# Patient Record
Sex: Male | Born: 1950 | Race: White | Hispanic: No | Marital: Married | Smoking: Former smoker
Health system: Southern US, Community
[De-identification: ages and names within clinical notes are randomized; demographics above are authoritative.]

## PROBLEM LIST (undated history)

## (undated) HISTORY — PX: JOINT REPLACEMENT: SHX530

## (undated) HISTORY — PX: BACK SURGERY: SHX140

---

## 2013-01-03 ENCOUNTER — Ambulatory Visit: Payer: Self-pay | Admitting: Family Medicine

## 2014-11-02 ENCOUNTER — Ambulatory Visit
Admission: EM | Admit: 2014-11-02 | Discharge: 2014-11-02 | Disposition: A | Discharge status: Home / Self Care | Payer: BC Managed Care – PPO | Attending: Family Medicine | Admitting: Family Medicine

## 2014-11-02 ENCOUNTER — Encounter: Payer: Self-pay | Admitting: Emergency Medicine

## 2014-11-02 ENCOUNTER — Ambulatory Visit: Payer: BC Managed Care – PPO

## 2014-11-02 DIAGNOSIS — J40 Bronchitis, not specified as acute or chronic: Secondary | ICD-10-CM | POA: Diagnosis not present

## 2014-11-02 DIAGNOSIS — J0100 Acute maxillary sinusitis, unspecified: Secondary | ICD-10-CM | POA: Diagnosis not present

## 2014-11-02 MED ORDER — BENZONATATE 100 MG PO CAPS: 100.0000 mg | ORAL_CAPSULE | Freq: Three times a day (TID) | ORAL | Status: DC | PRN

## 2014-11-02 MED ORDER — AZITHROMYCIN 250 MG PO TABS: ORAL_TABLET | ORAL | Status: DC

## 2014-11-02 NOTE — ED Notes (Signed)
Pt with a cough and a head cough with green drainage

## 2014-11-02 NOTE — ED Provider Notes (Signed)
[facility] Emergency Department Provider Note  ____________________________________________  Time seen: Approximately 9:41 AM  I have reviewed the triage vital signs and the nursing notes.   HISTORY  Chief Complaint Cough   HPI Derek Osborne is a 63 y.o. male presents for runny nose, sinus pressure, cough and congestion x 6-7 days. States started as runny nose then continued and progressed. States coughs more at night with post nasal drainage. States green thick nasal drainage. reports continues to eat and drink well. Denies chest pain, shortness of breath, wheezing, abdominal pain, fever. Reports continues to eat and drink well. States sinus pressure is 3/10 aching.    History reviewed. No pertinent past medical history.  Left hip replacement. Right cochlear implant 2005 Hyperlipidemia  There are no active problems to display for this patient.  Reports follows with his PCP regularly and had physical last month.  Past Surgical History  Procedure Laterality Date  . Back surgery      Current Outpatient Rx  Name  Route  Sig  Dispense  Refill  .           Marland Kitchen             Allergies Review of patient's allergies indicates no known allergies.  History reviewed. No pertinent family history.  Social History Social History  Substance Use Topics  . Smoking status: Never Smoker   . Smokeless tobacco: None  . Alcohol Use: No    Review of Systems Constitutional: No fever/chills Eyes: No visual changes. WUJ:WJXBJ nose, congestion and cough as above. Cardiovascular: Denies chest pain. Respiratory: Denies shortness of breath. Gastrointestinal: No abdominal pain.  No nausea, no vomiting.  No diarrhea.  No constipation. Genitourinary: Negative for dysuria. Musculoskeletal: Negative for back pain. Skin: Negative for rash. Neurological: Negative for headaches, focal weakness or numbness.  10-point ROS otherwise  negative.  ____________________________________________   PHYSICAL EXAM:  VITAL SIGNS: ED Triage Vitals  Enc Vitals Group     BP 11/02/14 0858 121/73 mmHg     Pulse Rate 11/02/14 0858 63     Resp 11/02/14 0858 18     Temp 11/02/14 0858 97.4 F (36.3 C)     Temp Source 11/02/14 0858 Tympanic     SpO2 11/02/14 0858 99 %     Weight 11/02/14 0858 182 lb (82.555 kg)     Height 11/02/14 0858 5\' 9"  (1.753 m)     Head Cir --      Peak Flow --      Pain Score 11/02/14 0900 3     Pain Loc --      Pain Edu? --      Excl. in GC? --     Constitutional: Alert and oriented. Well appearing and in no acute distress. Eyes: Conjunctivae are normal. PERRL. EOMI. Head: [employer].fr maxillary sinus TTP.   Ears: no erythema, no erythema, mild dullness bilaterally.   Nose: mild clear rhinorrhea, bilateral mild turbinate edema, bilateral nares patent.   Mouth/Throat: Mucous membranes are moist.  Mild pharyngeal erythema, no tonsillar swelling or exudate.  Neck: No stridor.  No cervical spine tenderness to palpation. Hematological/Lymphatic/Immunilogical: No cervical lymphadenopathy. Cardiovascular: Normal rate, regular rhythm. Grossly normal heart sounds.  Good peripheral circulation. Respiratory: Normal respiratory effort.  No retractions. No wheezes or rales. Mild intermittent scattered rhonchi .Dry intermittent cough.  Gastrointestinal: Soft and nontender. No distention. Normal Bowel sounds.   Musculoskeletal: No lower or upper extremity tenderness nor edema. Neurologic:  Normal speech and  language. No gross focal neurologic deficits are appreciated. No gait instability. Skin:  Skin is warm, dry and intact. No rash noted. Psychiatric: Mood and affect are normal. Speech and behavior are normal.  ____________________________________________   LABS (all labs ordered are listed, but only abnormal results are displayed)  Labs Reviewed - No data to display  RADIOLOGY  EXAM: CHEST 2  VIEW  COMPARISON: No priors.  FINDINGS: Lung volumes are normal. No consolidative airspace disease. No pleural effusions. No pneumothorax. No pulmonary nodule or mass noted. Pulmonary vasculature and the cardiomediastinal silhouette are within normal limits. Atherosclerosis in the thoracic aorta.  IMPRESSION: 1. No radiographic evidence of acute cardiopulmonary disease. 2. Atherosclerosis.   Electronically Signed By: Trudie Reed M.D. On: 11/02/2014 09:30  I, Renford Dills, personally viewed and evaluated these images (plain radiographs) as part of my medical decision making.  ____________________________________________   INITIAL IMPRESSION / ASSESSMENT AND PLAN / ED COURSE  Pertinent labs & imaging results that were available during my care of the patient were reviewed by me and considered in my medical decision making (see chart for details).  Very well appearing patient. No acute distress. Presents for runny nose, congestion, sinus drainage and intermittent cough x 6-7 days. Chest xray negative for acute cardiopulmonary disease. Discussed chest xray results as well as incidental finding of atherosclerotics, xray copy given to patient and patient verbalized understanding and will follow up with PCP.  Will treat sinusitis and bronchitis with azithromycin. Rest, fluids, and prn tessalon perles. Follow up with PCP this week. Discussed return parameters.  Patient verbalized understanding and agreed to plan.  ____________________________________________    FINAL CLINICAL IMPRESSION(S) / ED DIAGNOSES  Final diagnoses:  Acute maxillary sinusitis, recurrence not specified  Bronchitis       Renford Dills, NP 11/02/14 1004  Renford Dills, NP 11/02/14 1007

## 2014-11-02 NOTE — Discharge Instructions (Signed)
Take medication as prescribed. Rest. Drink plenty of water.   Follow up with your primary care physician this week as needed. Return to Urgent care for new or worsening concerns.   Sinusitis Sinusitis is redness, soreness, and puffiness (inflammation) of the air pockets in the bones of your face (sinuses). The redness, soreness, and puffiness can cause air and mucus to get trapped in your sinuses. This can allow germs to grow and cause an infection.  HOME CARE   Drink enough fluids to keep your pee (urine) clear or pale yellow.  Use a humidifier in your home.  Run a hot shower to create steam in the bathroom. Sit in the bathroom with the door closed. Breathe in the steam 3-4 times a day.  Put a warm, moist washcloth on your face 3-4 times a day, or as told by your doctor.  Use salt water sprays (saline sprays) to wet the thick fluid in your nose. This can help the sinuses drain.  Only take medicine as told by your doctor. GET HELP RIGHT AWAY IF:   Your pain gets worse.  You have very bad headaches.  You are sick to your stomach (nauseous).  You throw up (vomit).  You are very sleepy (drowsy) all the time.  Your face is puffy (swollen).  Your vision changes.  You have a stiff neck.  You have trouble breathing. MAKE SURE YOU:   Understand these instructions.  Will watch your condition.  Will get help right away if you are not doing well or get worse. Document Released: 08/02/2007 Document Revised: 11/08/2011 Document Reviewed: 09/19/2011 [facility] Patient Information 2015 [place], [place]. This information is not intended to replace advice given to you by your health care provider. Make sure you discuss any questions you have with your health care provider.

## 2017-06-06 ENCOUNTER — Other Ambulatory Visit: Payer: Self-pay

## 2017-06-06 ENCOUNTER — Ambulatory Visit
Admission: EM | Admit: 2017-06-06 | Discharge: 2017-06-06 | Disposition: A | Discharge status: Home / Self Care | Payer: Medicare Other | Attending: Family Medicine | Admitting: Family Medicine

## 2017-06-06 ENCOUNTER — Encounter: Payer: Self-pay | Admitting: Emergency Medicine

## 2017-06-06 DIAGNOSIS — R05 Cough: Secondary | ICD-10-CM | POA: Diagnosis not present

## 2017-06-06 DIAGNOSIS — R058 Other specified cough: Secondary | ICD-10-CM

## 2017-06-06 MED ORDER — HYDROCOD POLST-CPM POLST ER 10-8 MG/5ML PO SUER: 5.0000 mL | Freq: Every evening | ORAL | 0 refills | Status: DC | PRN

## 2017-06-06 MED ORDER — PREDNISONE 20 MG PO TABS: 40.0000 mg | ORAL_TABLET | Freq: Every day | ORAL | 0 refills | Status: DC

## 2017-06-06 MED ORDER — DOXYCYCLINE HYCLATE 100 MG PO CAPS: 100.0000 mg | ORAL_CAPSULE | Freq: Two times a day (BID) | ORAL | 0 refills | Status: DC

## 2017-06-06 MED ORDER — ALBUTEROL SULFATE HFA 108 (90 BASE) MCG/ACT IN AERS: 2.0000 | INHALATION_SPRAY | Freq: Four times a day (QID) | RESPIRATORY_TRACT | 0 refills | Status: DC | PRN

## 2017-06-06 NOTE — Discharge Instructions (Addendum)
Take medication as prescribed. Rest. Drink plenty of fluids.  ° °Follow up with your primary care physician this week as needed. Return to Urgent care for new or worsening concerns.  ° °

## 2017-06-06 NOTE — ED Triage Notes (Signed)
Patient in today c/o cough x 1 month. Patient has tried OTC Robitussin, Severe Congestion and Cough and Tessalon Perles. Patient denies any other symptoms. States he feels well except for the cough.

## 2017-06-06 NOTE — ED Provider Notes (Signed)
[facility] ____________________________________________  Time seen: Approximately 0900 AM  I have reviewed the triage vital signs and the nursing notes.   HISTORY  Chief Complaint Cough (APPT)   HPI Derek Osborne is a 67 y.o. male presenting for evaluation of cough that is been present for the last 3-4 weeks.  States initially started after his wife had had similar, and reports continues with cough.   reports has had some accompanying nasal congestion but denies sinus, pressure or sore throat.  Denies accompanying fevers.  States other than the cough he feels well.  States occasionally may hear himself wheeze, not consistently.  Denies associated chest pain or shortness of breath.  States cough is mostly a nonproductive hacking cough.  States sleep is disrupted from a cough.  Continues to remain active, and has continued to exercise at the gym.  States unresolved with multiple over-the-counter cough and congestion medications.  Also unresolved with leftover Tessalon Perles.  Denies other complaints.  Denies recent sickness. Denies recent antibiotic use.   [place]   History reviewed. No pertinent past medical history.  There are no active problems to display for this patient.   Past Surgical History:  Procedure Laterality Date  . BACK SURGERY    . JOINT REPLACEMENT Left    hip     No current facility-administered medications for this encounter.   Current Outpatient Medications:  .  acyclovir (ZOVIRAX) 800 MG tablet, Take 1 tablet by mouth daily., Disp: , Rfl:  .  aspirin 81 MG chewable tablet, Chew 1 tablet by mouth daily., Disp: , Rfl:  .  benzonatate (TESSALON PERLES) 100 MG capsule, Take 1 capsule (100 mg total) by mouth 3 (three) times daily as needed for cough., Disp: 15 capsule, Rfl: 0 .  Multiple Vitamin (MULTI-VITAMINS) TABS, Take 1 tablet by mouth daily., Disp: , Rfl:  .  albuterol (PROVENTIL HFA;VENTOLIN HFA) 108 (90  Base) MCG/ACT inhaler, Inhale 2 puffs into the lungs every 6 (six) hours as needed for wheezing or shortness of breath., Disp: 1 Inhaler, Rfl: 0 .  chlorpheniramine-HYDROcodone (TUSSIONEX PENNKINETIC ER) 10-8 MG/5ML SUER, Take 5 mLs by mouth at bedtime as needed for cough. do not drive or operate machinery while taking as can cause drowsiness., Disp: 50 mL, Rfl: 0 .  doxycycline (VIBRAMYCIN) 100 MG capsule, Take 1 capsule (100 mg total) by mouth 2 (two) times daily., Disp: 20 capsule, Rfl: 0 .  predniSONE (DELTASONE) 20 MG tablet, Take 2 tablets (40 mg total) by mouth daily., Disp: 10 tablet, Rfl: 0  Allergies Patient has no known allergies.  Family History  Problem Relation Age of Onset  . Colon cancer Mother   . Liver cancer Father     Social History Social History   Tobacco Use  . Smoking status: Former Smoker    Last attempt to quit: 06/06/1980    Years since quitting: 37.0  . Smokeless tobacco: Never Used  Substance Use Topics  . Alcohol use: No  . Drug use: Never    Review of Systems Constitutional: No fever/chills ENT: No sore throat. As above.  Cardiovascular: Denies chest pain. Respiratory: Denies shortness of breath. Gastrointestinal: No abdominal pain.   Musculoskeletal: Negative for back pain. Skin: Negative for rash.   ____________________________________________   PHYSICAL EXAM:  VITAL SIGNS: ED Triage Vitals  Enc Vitals Group     BP 06/06/17 0813 116/89     Pulse Rate 06/06/17 0813 64     Resp 06/06/17  0813 16     Temp 06/06/17 0813 97.8 F (36.6 C)     Temp Source 06/06/17 0813 Oral     SpO2 06/06/17 0813 100 %     Weight 06/06/17 0814 185 lb (83.9 kg)     Height 06/06/17 0814 5\' 10"  (1.778 m)     Head Circumference --      Peak Flow --      Pain Score 06/06/17 0814 0     Pain Loc --      Pain Edu? --      Excl. in GC? --     Constitutional: Alert and oriented. Well appearing and in no acute distress. Eyes: Conjunctivae are normal. Head:  Atraumatic. No sinus tenderness to palpation. No swelling. No erythema.  Ears: no erythema, normal TMs bilaterally.   Nose:No nasal congestion  Mouth/Throat: Mucous membranes are moist. No pharyngeal erythema. No tonsillar swelling or exudate.  Neck: No stridor.  No cervical spine tenderness to palpation. Hematological/Lymphatic/Immunilogical: No cervical lymphadenopathy. Cardiovascular: Normal rate, regular rhythm. Grossly normal heart sounds.  Good peripheral circulation. Respiratory: Normal respiratory effort.  No retractions. No wheezes, rales or rhonchi. Good air movement.  Dry intermittent cough noted with mild bronchospasm.  Speaks in complete sentences Musculoskeletal: Ambulatory with steady gait.  Neurologic:  Normal speech and language. No gait instability. Skin:  Skin appears warm, dry and intact. No rash noted. Psychiatric: Mood and affect are normal. Speech and behavior are normal.  ___________________________________________   LABS (all labs ordered are listed, but only abnormal results are displayed)  Labs Reviewed - No data to display ____________________________________________   PROCEDURES Procedures   INITIAL IMPRESSION / ASSESSMENT AND PLAN / ED COURSE  Pertinent labs & imaging results that were available during my care of the patient were reviewed by me and considered in my medical decision making (see chart for details).  Well-appearing patient.  No acute distress.  Suspect recentViral or allergy trigger with continued cough.  Will treat with as needed Tussionex at night, oral doxycycline, pre-and albuterol inhaler and prednisone.  Encourage rest, fluids, supportive care. Discussed indication, risks and benefits of medications with patient.  Discussed follow up with Primary care physician this week as needed. Discussed follow up and return parameters including no resolution or any worsening concerns. Patient verbalized understanding and agreed to plan.    ____________________________________________   FINAL CLINICAL IMPRESSION(S) / ED DIAGNOSES  Final diagnoses:  Cough present for greater than 3 weeks     ED Discharge Orders        Ordered    predniSONE (DELTASONE) 20 MG tablet  Daily     06/06/17 0838    chlorpheniramine-HYDROcodone (TUSSIONEX PENNKINETIC ER) 10-8 MG/5ML SUER  At bedtime PRN     06/06/17 0838    albuterol (PROVENTIL HFA;VENTOLIN HFA) 108 (90 Base) MCG/ACT inhaler  Every 6 hours PRN     06/06/17 0838    doxycycline (VIBRAMYCIN) 100 MG capsule  2 times daily     06/06/17 1610       Note: This dictation was prepared with Dragon dictation along with smaller phrase technology. Any transcriptional errors that result from this process are unintentional.         Renford Dills, NP 06/06/17 1228

## 2018-02-09 ENCOUNTER — Ambulatory Visit
Admission: EM | Admit: 2018-02-09 | Discharge: 2018-02-09 | Disposition: A | Discharge status: Home / Self Care | Payer: Medicare Other

## 2018-02-09 ENCOUNTER — Ambulatory Visit
Admission: EM | Admit: 2018-02-09 | Discharge: 2018-02-09 | Disposition: A | Discharge status: Home / Self Care | Payer: Medicare Other | Attending: Emergency Medicine | Admitting: Emergency Medicine

## 2018-02-09 ENCOUNTER — Other Ambulatory Visit: Payer: Self-pay

## 2018-02-09 DIAGNOSIS — B349 Viral infection, unspecified: Secondary | ICD-10-CM | POA: Insufficient documentation

## 2018-02-09 DIAGNOSIS — R509 Fever, unspecified: Secondary | ICD-10-CM

## 2018-02-09 DIAGNOSIS — M7918 Myalgia, other site: Secondary | ICD-10-CM

## 2018-02-09 LAB — RAPID INFLUENZA A&B ANTIGENS
Influenza A (ARMC): NEGATIVE
Influenza B (ARMC): NEGATIVE

## 2018-02-09 NOTE — ED Triage Notes (Signed)
Pt asking for flu test. Has been having chills and generalized bodyaches. "Low grade fever". Sx for past 2 days

## 2018-02-09 NOTE — Discharge Instructions (Signed)
Tylenol or Motrin for your chills and body aches.  If you do have recurrence of your symptoms or they worsen return to our clinic or go to your primary care physician.

## 2018-02-09 NOTE — ED Provider Notes (Signed)
[facility]    CSN: [id number] Arrival date & time: 02/09/18  0841     History   Chief Complaint Chief Complaint  Patient presents with  . Generalized Body Aches  . Chills    HPI Derek Osborne is a 67 y.o. male.   HPI  67 year old male presents with concerns regarding the flu.  He states that for the past 2 days he has had chills and generalized body aches.  He has had a low-grade fever currently at 99 degrees.  He had his flu shot earlier in the year.  He is mostly concerned that he has a party for his 67-year-old grandson and does not want to infect him.  He denies any cough.  He has had no sinus drainage.  Today he appears and feels much better.       History reviewed. No pertinent past medical history.  There are no active problems to display for this patient.   Past Surgical History:  Procedure Laterality Date  . BACK SURGERY    . JOINT REPLACEMENT Left    hip       Home Medications    Prior to Admission medications   Medication Sig Start Date End Date Taking? Authorizing Provider  acyclovir (ZOVIRAX) 800 MG tablet Take 1 tablet by mouth daily. 05/13/16   [provider]  aspirin 81 MG chewable tablet Chew 1 tablet by mouth daily.    [provider]  Multiple Vitamin (MULTI-VITAMINS) TABS Take 1 tablet by mouth daily.    [provider]    Family History Family History  Problem Relation Age of Onset  . Colon cancer Mother   . Liver cancer Father     Social History Social History   Tobacco Use  . Smoking status: Former Smoker    Last attempt to quit: 06/06/1980    Years since quitting: 37.7  . Smokeless tobacco: Never Used  Substance Use Topics  . Alcohol use: No  . Drug use: Never     Allergies   Patient has no known allergies.   Review of Systems Review of Systems  Constitutional: Positive for activity change, chills, fatigue and fever. Negative for appetite change.  HENT: Negative for  congestion and sore throat.   Respiratory: Negative for cough.   All other systems reviewed and are negative.    Physical Exam Triage Vital Signs ED Triage Vitals  Enc Vitals Group     BP 02/09/18 0856 114/72     Pulse Rate 02/09/18 0856 62     Resp 02/09/18 0856 16     Temp 02/09/18 0856 99 F (37.2 C)     Temp Source 02/09/18 0856 Oral     SpO2 02/09/18 0856 99 %     Weight 02/09/18 0857 185 lb (83.9 kg)     Height 02/09/18 0857 5\' 9"  (1.753 m)     Head Circumference --      Peak Flow --      Pain Score 02/09/18 0856 2     Pain Loc --      Pain Edu? --      Excl. in GC? --    No data found.  Updated Vital Signs BP 114/72 (BP Location: Left Arm)   Pulse 62   Temp 99 F (37.2 C) (Oral)   Resp 16   Ht 5\' 9"  (1.753 m)   Wt 185 lb (83.9 kg)   SpO2 99%   BMI 27.32 kg/m  Visual Acuity Right Eye Distance:   Left Eye Distance:   Bilateral Distance:    Right Eye Near:   Left Eye Near:    Bilateral Near:     Physical Exam Vitals signs and nursing note reviewed.  Constitutional:      General: He is not in acute distress.    Appearance: Normal appearance. He is normal weight. He is not ill-appearing, toxic-appearing or diaphoretic.  HENT:     Head: Normocephalic.     Comments: Wearing hearing aid from    Right Ear: Tympanic membrane and ear canal normal.     Left Ear: Tympanic membrane and ear canal normal.     Nose: Nose normal.     Mouth/Throat:     Mouth: Mucous membranes are moist.  Eyes:     Extraocular Movements: Extraocular movements intact.     Conjunctiva/sclera: Conjunctivae normal.     Pupils: Pupils are equal, round, and reactive to light.  Neck:     Musculoskeletal: Normal range of motion and neck supple.  Pulmonary:     Effort: Pulmonary effort is normal.     Breath sounds: Normal breath sounds.  Musculoskeletal: Normal range of motion.  Skin:    General: Skin is warm and dry.  Neurological:     General: No focal deficit present.      Mental Status: He is alert and oriented to person, place, and time.  Psychiatric:        Mood and Affect: Mood normal.        Behavior: Behavior normal.        Thought Content: Thought content normal.        Judgment: Judgment normal.      UC Treatments / Results  Labs (all labs ordered are listed, but only abnormal results are displayed) Labs Reviewed  RAPID INFLUENZA A&B ANTIGENS ([facility] ONLY)    EKG None  Radiology No results found.  Procedures Procedures (including critical care time)  Medications Ordered in UC Medications - No data to display  Initial Impression / Assessment and Plan / UC Course  I have reviewed the triage vital signs and the nursing notes.  Pertinent labs & imaging results that were available during my care of the patient were reviewed by me and considered in my medical decision making (see chart for details).   Have explained to the patient that his exam today is normal.  Not appear ill.  He had a flu shot earlier this year and a likely flulike illness which is resolving quickly.  Him to be cautious around his grandson and should wear masks with close contact.  Should also use the hand sanitizers and wash his hands frequently.  If he does develop worsening symptoms he should return to our clinic or go to his primary care physician.   Final Clinical Impressions(s) / UC Diagnoses   Final diagnoses:  Viral illness     Discharge Instructions     Tylenol or Motrin for your chills and body aches.  If you do have recurrence of your symptoms or they worsen return to our clinic or go to your primary care physician.   ED Prescriptions    None     Controlled Substance Prescriptions [place] Controlled Substance Registry consulted? Not Applicable   Lutricia Feil, PA-C 02/09/18 1206

## 2018-02-12 ENCOUNTER — Ambulatory Visit (INDEPENDENT_AMBULATORY_CARE_PROVIDER_SITE_OTHER): Payer: Medicare Other

## 2018-02-12 ENCOUNTER — Ambulatory Visit
Admission: EM | Admit: 2018-02-12 | Discharge: 2018-02-12 | Disposition: A | Discharge status: Home / Self Care | Payer: Medicare Other | Attending: Internal Medicine | Admitting: Internal Medicine

## 2018-02-12 ENCOUNTER — Encounter: Payer: Self-pay | Admitting: Emergency Medicine

## 2018-02-12 ENCOUNTER — Other Ambulatory Visit: Payer: Self-pay

## 2018-02-12 DIAGNOSIS — J181 Lobar pneumonia, unspecified organism: Secondary | ICD-10-CM | POA: Insufficient documentation

## 2018-02-12 DIAGNOSIS — J189 Pneumonia, unspecified organism: Secondary | ICD-10-CM

## 2018-02-12 MED ORDER — HYDROCOD POLST-CPM POLST ER 10-8 MG/5ML PO SUER: 5.0000 mL | Freq: Two times a day (BID) | ORAL | 0 refills | Status: DC

## 2018-02-12 MED ORDER — AZITHROMYCIN 250 MG PO TABS: 250.0000 mg | ORAL_TABLET | Freq: Every day | ORAL | 0 refills | Status: DC

## 2018-02-12 MED ORDER — ALBUTEROL SULFATE HFA 108 (90 BASE) MCG/ACT IN AERS: 1.0000 | INHALATION_SPRAY | Freq: Four times a day (QID) | RESPIRATORY_TRACT | 0 refills | Status: DC | PRN

## 2018-02-12 NOTE — ED Provider Notes (Signed)
[facility]    CSN: [id number] Arrival date & time: 02/12/18  0803     History   Chief Complaint Chief Complaint  Patient presents with  . Cough    HPI Derek Osborne is a 67 y.o. male.   HPI  67 year old male returns today after being seen on Saturday 3days before this visit and seen by the undersigned.  At that time he was diagnosed with a viral illness.  He states that since that visit he has developed a nagging cough.  He said it is productive on occasion of yellow sputum.  He states it bothers him most is that he gets very winded with very little activity.  He denies any swelling of his feet.  He has had no history of lung disease.  He is a former smoker.  He states surprisingly he is feeling better than he did when he came in before but now is concerned with the cough and the shortness of breath and states that it usually will go into a bronchitis.  [place] at [place] end of the [place].  They are driving with 4 other people.       History reviewed. No pertinent past medical history.  There are no active problems to display for this patient.   Past Surgical History:  Procedure Laterality Date  . BACK SURGERY    . JOINT REPLACEMENT Left    hip       Home Medications    Prior to Admission medications   Medication Sig Start Date End Date Taking? Authorizing Provider  aspirin 81 MG chewable tablet Chew 1 tablet by mouth daily.   Yes [provider]  Multiple Vitamin (MULTI-VITAMINS) TABS Take 1 tablet by mouth daily.   Yes [provider]  acyclovir (ZOVIRAX) 800 MG tablet Take 1 tablet by mouth daily. 05/13/16   [provider]  albuterol (PROVENTIL HFA;VENTOLIN HFA) 108 (90 Base) MCG/ACT inhaler Inhale 1-2 puffs into the lungs every 6 (six) hours as needed for wheezing or shortness of breath. Use with spacer 02/12/18   Lutricia Feiloemer, Naila Elizondo P, PA-C  azithromycin (ZITHROMAX) 250 MG tablet Take 1 tablet (250  mg total) by mouth daily. Take first 2 tablets together, then 1 every day until finished. 02/12/18   Lutricia Feiloemer, Erilyn Pearman P, PA-C  chlorpheniramine-HYDROcodone (TUSSIONEX PENNKINETIC ER) 10-8 MG/5ML SUER Take 5 mLs by mouth 2 (two) times daily. 02/12/18   Lutricia Feiloemer, Marcellis Frampton P, PA-C    Family History Family History  Problem Relation Age of Onset  . Colon cancer Mother   . Liver cancer Father     Social History Social History   Tobacco Use  . Smoking status: Former Smoker    Last attempt to quit: 06/06/1980    Years since quitting: 37.7  . Smokeless tobacco: Never Used  Substance Use Topics  . Alcohol use: No  . Drug use: Never     Allergies   Patient has no known allergies.   Review of Systems Review of Systems  Constitutional: Positive for activity change. Negative for chills, fatigue and fever.  HENT: Positive for congestion.   Respiratory: Positive for cough and shortness of breath.   All other systems reviewed and are negative.    Physical Exam Triage Vital Signs ED Triage Vitals  Enc Vitals Group     BP 02/12/18 0818 119/87     Pulse Rate 02/12/18 0818 65     Resp 02/12/18 0818 16  Temp 02/12/18 0818 98.4 F (36.9 C)     Temp Source 02/12/18 0818 Oral     SpO2 02/12/18 0818 99 %     Weight 02/12/18 0815 185 lb (83.9 kg)     Height 02/12/18 0815 5\' 9"  (1.753 m)     Head Circumference --      Peak Flow --      Pain Score 02/12/18 0815 0     Pain Loc --      Pain Edu? --      Excl. in GC? --    No data found.  Updated Vital Signs BP 119/87 (BP Location: Left Arm)   Pulse 65   Temp 98.4 F (36.9 C) (Oral)   Resp 16   Ht 5\' 9"  (1.753 m)   Wt 185 lb (83.9 kg)   SpO2 99%   BMI 27.32 kg/m   Visual Acuity Right Eye Distance:   Left Eye Distance:   Bilateral Distance:    Right Eye Near:   Left Eye Near:    Bilateral Near:     Physical Exam Vitals signs and nursing note reviewed.  Constitutional:      General: He is not in acute distress.     Appearance: Normal appearance. He is normal weight. He is not ill-appearing, toxic-appearing or diaphoretic.  HENT:     Head: Normocephalic.     Left Ear: Tympanic membrane normal.     Nose: Nose normal.     Mouth/Throat:     Mouth: Mucous membranes are moist.     Pharynx: No oropharyngeal exudate or posterior oropharyngeal erythema.  Eyes:     General:        Right eye: No discharge.        Left eye: No discharge.     Pupils: Pupils are equal, round, and reactive to light.  Neck:     Musculoskeletal: Normal range of motion.  Pulmonary:     Effort: Pulmonary effort is normal.     Breath sounds: Rales present.     Comments: Patient has bibasilar crackles in the lower thirds worse on the left than the right. Abdominal:     General: Abdomen is flat.     Palpations: Abdomen is soft.  Musculoskeletal: Normal range of motion.  Skin:    General: Skin is warm and dry.  Neurological:     General: No focal deficit present.     Mental Status: He is alert and oriented to person, place, and time.  Psychiatric:        Mood and Affect: Mood normal.        Behavior: Behavior normal.        Thought Content: Thought content normal.        Judgment: Judgment normal.      UC Treatments / Results  Labs (all labs ordered are listed, but only abnormal results are displayed) Labs Reviewed - No data to display  EKG None  Radiology Dg Chest 2 View  Result Date: 02/12/2018 CLINICAL DATA:  Cough, short of breath for 5 days with some exertion, chills, former smoking history EXAM: CHEST - 2 VIEW COMPARISON:  Chest x-ray of 11/02/2014 FINDINGS: There is slightly increased opacity posteriorly at the lung base on the lateral view which may be of medially at the right lung base on the frontal view suspicious for a patchy area of pneumonia. Otherwise the lungs are clear with minimal volume loss at the right lung base. No pleural effusion is  seen. Mediastinal and hilar contours are unremarkable and mild  cardiomegaly is stable. There are degenerative changes in the mid to lower thoracic spine. IMPRESSION: Suspect patchy infiltrate posteriorly at the lung base on the lateral view possibly the medial right lung base. Recommend follow-up. Electronically Signed   By: Dwyane Dee M.D.   On: 02/12/2018 08:50    Procedures Procedures (including critical care time)  Medications Ordered in UC Medications - No data to display  Initial Impression / Assessment and Plan / UC Course  I have reviewed the triage vital signs and the nursing notes.  Pertinent labs & imaging results that were available during my care of the patient were reviewed by me and considered in my medical decision making (see chart for details).   Has probable community-acquired pneumonia.  Him with Romycin and albuterol for shortness of breath.  Also provide him with Tussionex for cough suppressant.  Told him that he must have a follow-up chest x-ray in 4 weeks to prove cure.   Final Clinical Impressions(s) / UC Diagnoses   Final diagnoses:  Community acquired pneumonia of right middle lobe of lung [facility])   Discharge Instructions   None    ED Prescriptions    Medication Sig Dispense Auth. Provider   azithromycin (ZITHROMAX) 250 MG tablet Take 1 tablet (250 mg total) by mouth daily. Take first 2 tablets together, then 1 every day until finished. 6 tablet Ovid Curd P, PA-C   albuterol (PROVENTIL HFA;VENTOLIN HFA) 108 (90 Base) MCG/ACT inhaler Inhale 1-2 puffs into the lungs every 6 (six) hours as needed for wheezing or shortness of breath. Use with spacer 1 Inhaler Lutricia Feil, PA-C   chlorpheniramine-HYDROcodone (TUSSIONEX PENNKINETIC ER) 10-8 MG/5ML SUER Take 5 mLs by mouth 2 (two) times daily. 115 mL Lutricia Feil, PA-C     Controlled Substance Prescriptions [place] Controlled Substance Registry consulted? Not Applicable   Lutricia Feil, PA-C 02/12/18 1615

## 2018-02-12 NOTE — ED Triage Notes (Signed)
Patient states that he was seen here on Saturday for viral illness.  Patient states that he has developed a cough for the past 2 days.

## 2019-09-10 ENCOUNTER — Encounter: Payer: Self-pay | Admitting: Emergency Medicine

## 2019-09-10 ENCOUNTER — Ambulatory Visit
Admission: EM | Admit: 2019-09-10 | Discharge: 2019-09-10 | Disposition: A | Discharge status: Home / Self Care | Payer: Medicare PPO | Attending: Family Medicine | Admitting: Family Medicine

## 2019-09-10 DIAGNOSIS — S0501XA Injury of conjunctiva and corneal abrasion without foreign body, right eye, initial encounter: Secondary | ICD-10-CM

## 2019-09-10 DIAGNOSIS — H1131 Conjunctival hemorrhage, right eye: Secondary | ICD-10-CM | POA: Diagnosis not present

## 2019-09-10 MED ORDER — MOXIFLOXACIN HCL 0.5 % OP SOLN: 1.0000 [drp] | Freq: Three times a day (TID) | OPHTHALMIC | 0 refills | Status: DC

## 2019-09-10 NOTE — ED Triage Notes (Signed)
Patient states he got saw dust in his right eye on Saturday. He states his eye is very irritated.

## 2019-09-14 NOTE — ED Provider Notes (Signed)
[facility]    CSN: [id number] Arrival date & time: 09/10/19  1613      History   Chief Complaint Chief Complaint  Patient presents with   Eye Problem    HPI Derek Osborne is a 69 y.o. male.   69 yo male with a c/o right eye irritation since Saturday after getting some saw dust in his eye. Denies pain but states it's become more red. Denies any drainage, fevers, chills, vision changes.    Eye Problem   History reviewed. No pertinent past medical history.  There are no problems to display for this patient.   Past Surgical History:  Procedure Laterality Date   BACK SURGERY     JOINT REPLACEMENT Left    hip       Home Medications    Prior to Admission medications   Medication Sig Start Date End Date Taking? Authorizing Provider  acyclovir (ZOVIRAX) 800 MG tablet Take 1 tablet by mouth daily. 05/13/16  Yes [provider]  albuterol (PROVENTIL HFA;VENTOLIN HFA) 108 (90 Base) MCG/ACT inhaler Inhale 1-2 puffs into the lungs every 6 (six) hours as needed for wheezing or shortness of breath. Use with spacer 02/12/18  Yes Lutricia Feil, PA-C  aspirin 81 MG chewable tablet Chew 1 tablet by mouth daily.   Yes [provider]  tamsulosin (FLOMAX) 0.4 MG CAPS capsule Take 0.4 mg by mouth daily. 07/06/19  Yes [provider]  azithromycin (ZITHROMAX) 250 MG tablet Take 1 tablet (250 mg total) by mouth daily. Take first 2 tablets together, then 1 every day until finished. 02/12/18   Lutricia Feil, PA-C  chlorpheniramine-HYDROcodone (TUSSIONEX PENNKINETIC ER) 10-8 MG/5ML SUER Take 5 mLs by mouth 2 (two) times daily. 02/12/18   Lutricia Feil, PA-C  moxifloxacin (VIGAMOX) 0.5 % ophthalmic solution Place 1 drop into the right eye 3 (three) times daily. 09/10/19   Payton Mccallum, MD  Multiple Vitamin (MULTI-VITAMINS) TABS Take 1 tablet by mouth daily.    [provider]    Family History Family History  Problem Relation  Age of Onset   Colon cancer Mother    Liver cancer Father     Social History Social History   Tobacco Use   Smoking status: Former Smoker    Quit date: 06/06/1980    Years since quitting: 39.2   Smokeless tobacco: Never Used  Vaping Use   Vaping Use: Never used  Substance Use Topics   Alcohol use: No   Drug use: Never     Allergies   Patient has no known allergies.   Review of Systems Review of Systems   Physical Exam Triage Vital Signs ED Triage Vitals  Enc Vitals Group     BP 09/10/19 1635 (!) 147/87     Pulse Rate 09/10/19 1635 66     Resp 09/10/19 1635 18     Temp 09/10/19 1635 98.4 F (36.9 C)     Temp Source 09/10/19 1635 Oral     SpO2 09/10/19 1635 100 %     Weight 09/10/19 1632 184 lb 15.5 oz (83.9 kg)     Height 09/10/19 1632 5\' 9"  (1.753 m)     Head Circumference --      Peak Flow --      Pain Score 09/10/19 1632 0     Pain Loc --      Pain Edu? --      Excl. in GC? --    No data  found.  Updated Vital Signs BP (!) 147/87 (BP Location: Right Arm)    Pulse 66    Temp 98.4 F (36.9 C) (Oral)    Resp 18    Ht 5\' 9"  (1.753 m)    Wt 83.9 kg    SpO2 100%    BMI 27.31 kg/m   Visual Acuity Right Eye Distance:   Left Eye Distance:   Bilateral Distance:    Right Eye Near:   Left Eye Near:    Bilateral Near:     Physical Exam Vitals and nursing note reviewed.  Constitutional:      General: He is not in acute distress.    Appearance: He is not toxic-appearing or diaphoretic.  Eyes:     General: Lids are normal. Lids are everted, no foreign bodies appreciated.     Extraocular Movements: Extraocular movements intact.     Conjunctiva/sclera:     Right eye: Hemorrhage present.     Pupils: Pupils are equal, round, and reactive to light.     Right eye: Corneal abrasion and fluorescein uptake present.  Neurological:     Mental Status: He is alert.      UC Treatments / Results  Labs (all labs ordered are listed, but only abnormal results  are displayed) Labs Reviewed - No data to display  EKG   Radiology No results found.  Procedures Procedures (including critical care time)  Medications Ordered in UC Medications - No data to display  Initial Impression / Assessment and Plan / UC Course  I have reviewed the triage vital signs and the nursing notes.  Pertinent labs & imaging results that were available during my care of the patient were reviewed by me and considered in my medical decision making (see chart for details).      Final Clinical Impressions(s) / UC Diagnoses   Final diagnoses:  Subconjunctival hemorrhage of right eye  Abrasion of right cornea, initial encounter    ED Prescriptions    Medication Sig Dispense Auth. Provider   moxifloxacin (VIGAMOX) 0.5 % ophthalmic solution Place 1 drop into the right eye 3 (three) times daily. 3 mL , MD      1.  diagnosis reviewed with patient 2. rx as per orders above; reviewed possible side effects, interactions, risks and benefits  3. Follow-up prn if symptoms worsen or don't improve  PDMP not reviewed this encounter.   Payton Mccallum, MD 09/14/19 1650

## 2020-07-07 IMAGING — CR DG CHEST 2V
2 series · 2 of 2 positions shown · non-contrast
Comparison: Chest x-ray of 11/02/2014

CLINICAL DATA: Cough, short of breath for 5 days with some
exertion, chills, former smoking history

EXAM:
CHEST - 2 VIEW

[chest pa]
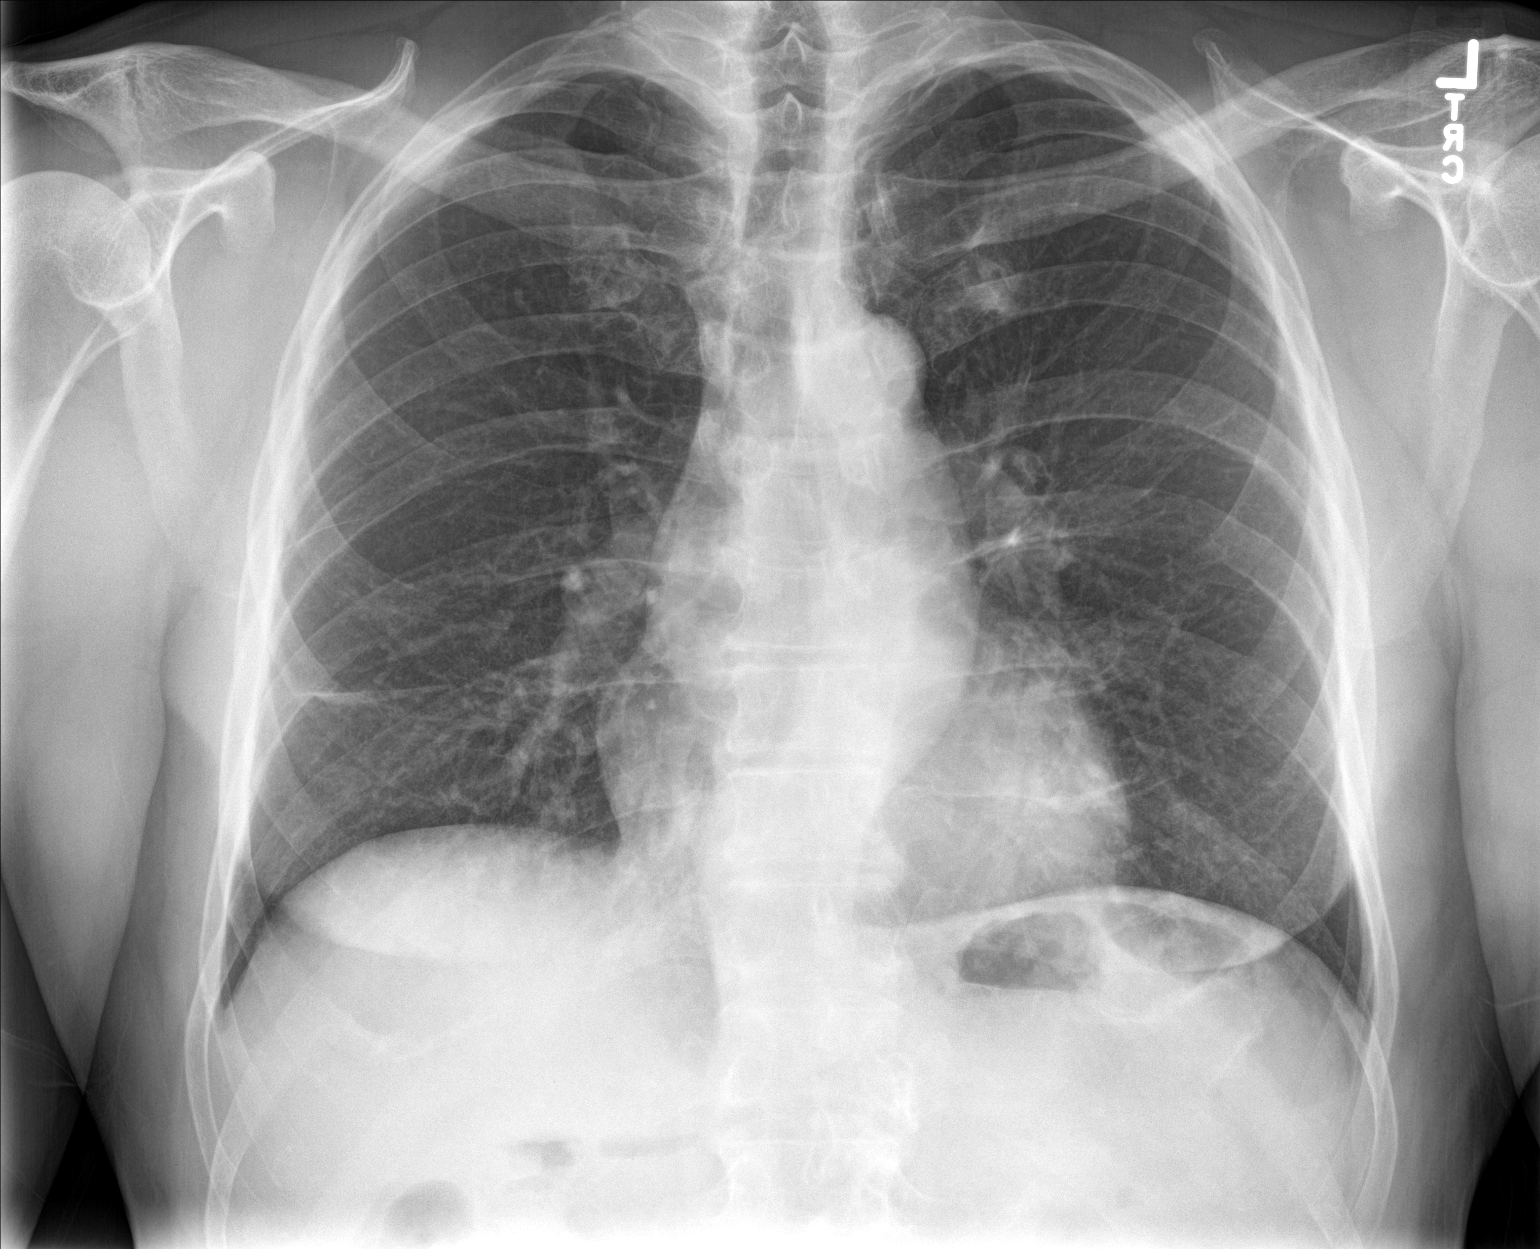

[chest lat]
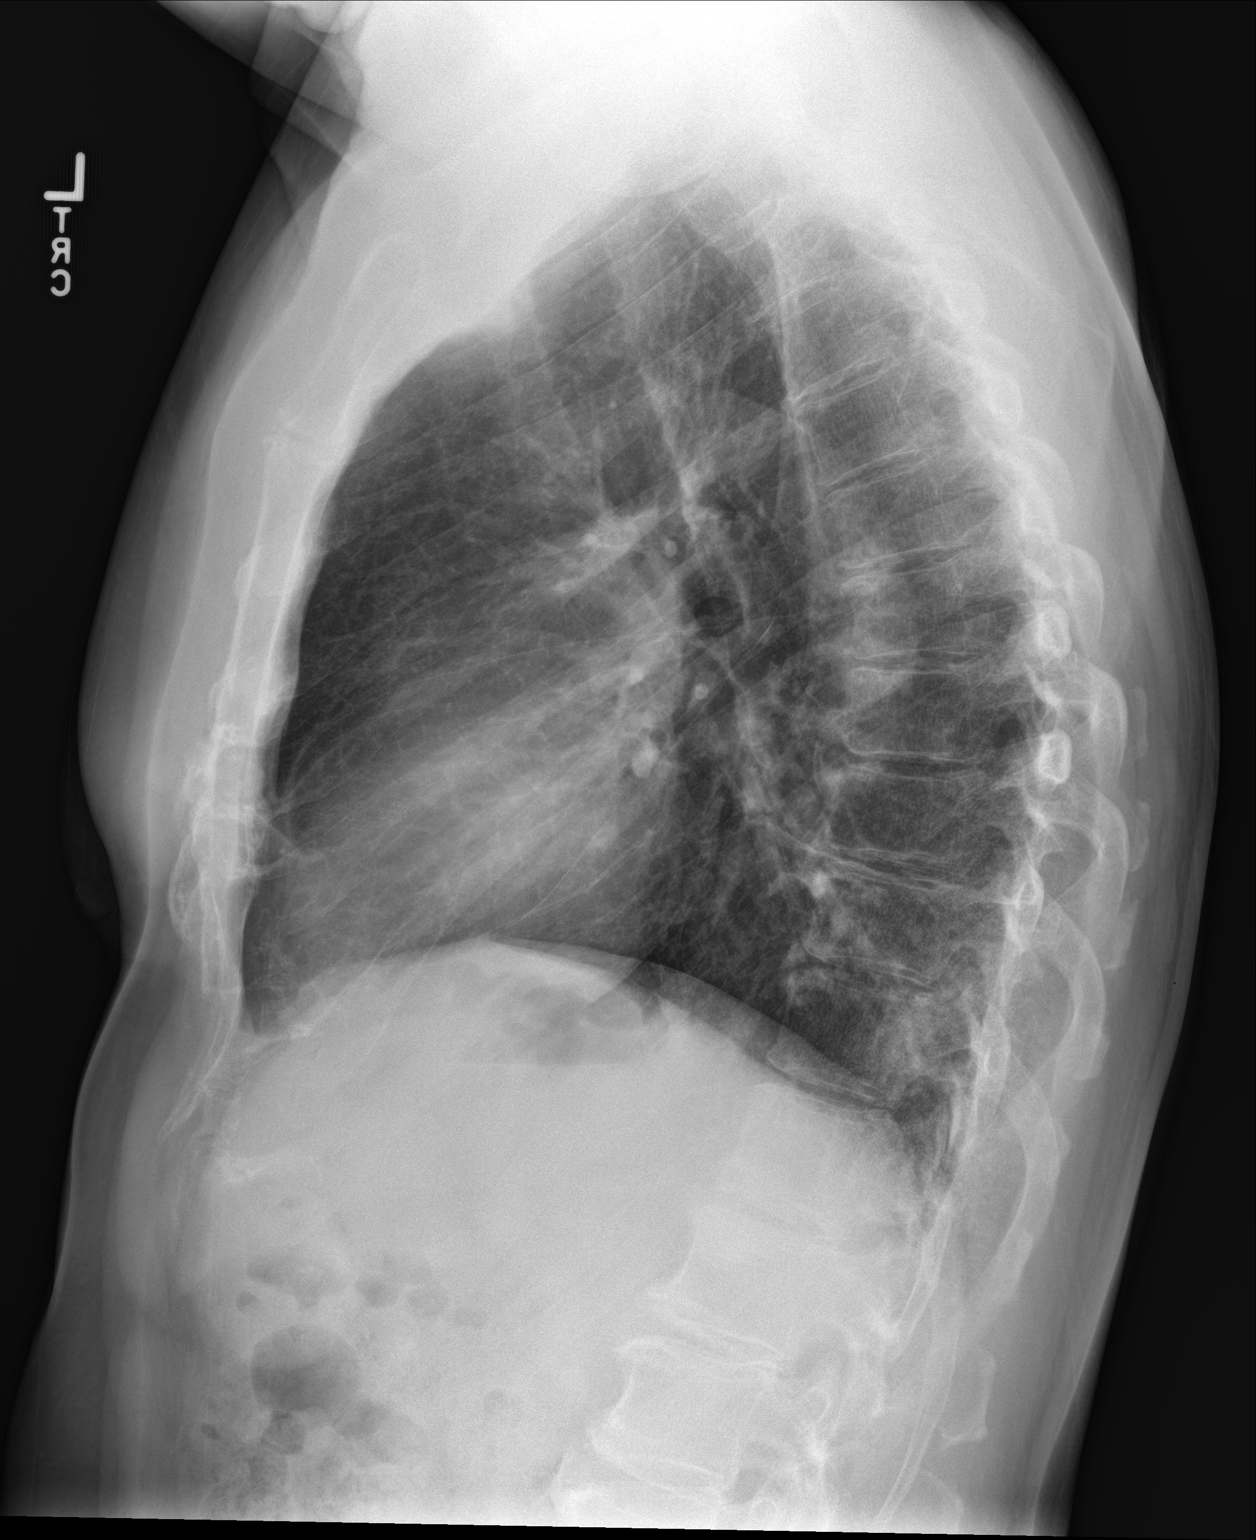

[2 of 2 positions shown; findings below may reference images not displayed]

FINDINGS: There is slightly increased opacity posteriorly at the lung base on
the lateral view which may be of medially at the right lung base on
the frontal view suspicious for a patchy area of pneumonia.
Otherwise the lungs are clear with minimal volume loss at the right
lung base. No pleural effusion is seen. Mediastinal and hilar
contours are unremarkable and mild cardiomegaly is stable. There are
degenerative changes in the mid to lower thoracic spine.
IMPRESSION: Suspect patchy infiltrate posteriorly at the lung base on the
lateral view possibly the medial right lung base. Recommend
follow-up.

## 2020-11-08 ENCOUNTER — Other Ambulatory Visit: Payer: Self-pay

## 2020-11-08 ENCOUNTER — Ambulatory Visit
Admission: RE | Admit: 2020-11-08 | Discharge: 2020-11-08 | Disposition: A | Discharge status: Home / Self Care | Payer: Medicare PPO | Source: Ambulatory Visit

## 2020-11-08 VITALS — BP 130/82 | HR 71 | Temp 98.6°F | Resp 18 | Ht 69.0 in | Wt 185.0 lb

## 2020-11-08 DIAGNOSIS — U071 COVID-19: Secondary | ICD-10-CM

## 2020-11-08 MED ORDER — HYDROCODONE BIT-HOMATROP MBR 5-1.5 MG/5ML PO SOLN: 5.0000 mL | Freq: Every day | ORAL | 0 refills | Status: AC

## 2020-11-08 MED ORDER — MOLNUPIRAVIR 200 MG PO CAPS: 800.0000 mg | ORAL_CAPSULE | Freq: Two times a day (BID) | ORAL | 0 refills | Status: AC

## 2020-11-08 MED ORDER — BENZONATATE 200 MG PO CAPS: 200.0000 mg | ORAL_CAPSULE | Freq: Two times a day (BID) | ORAL | 0 refills | Status: AC | PRN

## 2020-11-08 NOTE — ED Triage Notes (Signed)
Pt states he tested positive for covid about 2 days ago. Symptoms started the day prior. He states he has runny nose, nasal congestion and cough. He states the cough is the worst and has made his chest feel heavy and sore from coughing. He states when he gets a cold like this it usually turns into bronchitis or pneumonia.

## 2020-11-08 NOTE — ED Provider Notes (Signed)
[facility]    CSN: [id number] Arrival date & time: 11/08/20  2376      History   Chief Complaint Chief Complaint  Patient presents with   Appointment   Covid Positive    HPI Derek Osborne is a 70 y.o. male who presents with cough and rhinitis and mild aches which started 3 days ago. His cough is getting worse and gets bouts of cough attacks which makes his chest sore. He had a positive covid test yesterday. His wife also has covid, but is doing better. He has had 3 covid injections, and never had covid before. Has been fatigued and his appetite has been a little down. Has not had fever, chills or sweats. Only mild HA.    History reviewed. No pertinent past medical history.  There are no problems to display for this patient.   Past Surgical History:  Procedure Laterality Date   BACK SURGERY     JOINT REPLACEMENT Left    hip       Home Medications    Prior to Admission medications   Medication Sig Start Date End Date Taking? Authorizing Provider  acyclovir (ZOVIRAX) 800 MG tablet Take 1 tablet by mouth daily. 05/13/16  Yes [provider]  atorvastatin (LIPITOR) 20 MG tablet Take by mouth. 05/07/20 06/10/21 Yes [provider]  benzonatate (TESSALON) 200 MG capsule Take 1 capsule (200 mg total) by mouth 2 (two) times daily as needed for cough. For day time cough 11/08/20  Yes Rodriguez-Southworth, Nettie Elm, PA-C  HYDROcodone bit-homatropine (HYCODAN) 5-1.5 MG/5ML syrup Take 5 mLs by mouth at bedtime. 11/08/20  Yes Rodriguez-Southworth, Nettie Elm, PA-C  molnupiravir EUA 200 MG CAPS Take 4 capsules (800 mg total) by mouth 2 (two) times daily. 11/08/20  Yes Rodriguez-Southworth, Nettie Elm, PA-C  Multiple Vitamin (MULTI-VITAMINS) TABS Take 1 tablet by mouth daily.   Yes [provider]  tamsulosin (FLOMAX) 0.4 MG CAPS capsule Take 0.4 mg by mouth daily. 07/06/19  Yes [provider]    Family History Family History  Problem Relation Age  of Onset   Colon cancer Mother    Liver cancer Father     Social History Social History   Tobacco Use   Smoking status: Former    Types: Cigarettes    Quit date: 06/06/1980    Years since quitting: 40.4   Smokeless tobacco: Never  Vaping Use   Vaping Use: Never used  Substance Use Topics   Alcohol use: No   Drug use: Never     Allergies   Patient has no known allergies.   Review of Systems Review of Systems  Constitutional:  Positive for activity change, appetite change and fatigue. Negative for chills, diaphoresis and fever.  HENT:  Positive for rhinorrhea. Negative for ear discharge, ear pain, sore throat and trouble swallowing.   Eyes:  Negative for discharge.  Respiratory:  Positive for cough. Negative for chest tightness.   Cardiovascular:  Negative for chest pain.  Gastrointestinal:  Negative for diarrhea, nausea and vomiting.  Musculoskeletal:  Positive for myalgias.  Skin:  Negative for rash.  Neurological:  Negative for headaches.  Hematological:  Negative for adenopathy.    Physical Exam Triage Vital Signs ED Triage Vitals [11/08/20 0908]  Enc Vitals Group     BP 130/82     Pulse Rate 71     Resp 18     Temp 98.6 F (37 C)     Temp Source Oral     SpO2  99 %     Weight 184 lb 15.5 oz (83.9 kg)     Height 5\' 9"  (1.753 m)     Head Circumference      Peak Flow      Pain Score 3     Pain Loc      Pain Edu?      Excl. in GC?    No data found.  Updated Vital Signs BP 130/82 (BP Location: Left Arm)   Pulse 71   Temp 98.6 F (37 C) (Oral)   Resp 18   Ht 5\' 9"  (1.753 m)   Wt 184 lb 15.5 oz (83.9 kg)   SpO2 99%   BMI 27.31 kg/m   Visual Acuity Right Eye Distance:   Left Eye Distance:   Bilateral Distance:    Right Eye Near:   Left Eye Near:    Bilateral Near:     Physical Exam Physical Exam Vitals signs and nursing note reviewed.  Constitutional:      General: he is not in acute distress.    Appearance: Normal appearance. he is not  ill-appearing, toxic-appearing or diaphoretic.  HENT:     Head: Normocephalic.     Right Ear: Tympanic membrane, ear canal and external ear normal.     Left Ear: Tympanic membrane, ear canal and external ear normal.     Nose: Nose normal.     Mouth/Throat:     Mouth: Mucous membranes are moist.  Eyes:     General: No scleral icterus.       Right eye: No discharge.        Left eye: No discharge.     Conjunctiva/sclera: Conjunctivae normal.  Neck:     Musculoskeletal: Neck supple. No neck rigidity.  Cardiovascular:     Rate and Rhythm: Normal rate and regular rhythm.     Heart sounds: No murmur.  Pulmonary:     Effort: Pulmonary effort is normal.     Breath sounds: Normal breath sounds.   Musculoskeletal: Normal range of motion.  Lymphadenopathy:     Cervical: No cervical adenopathy.  Skin:    General: Skin is warm and dry.     Coloration: Skin is not jaundiced.     Findings: No rash.  Neurological:     Mental Status: he is alert and oriented to person, place, and time.     Gait: Gait normal.  Psychiatric:        Mood and Affect: Mood normal.        Behavior: Behavior normal.        Thought Content: Thought content normal.        Judgment: Judgment normal.    UC Treatments / Results  Labs (all labs ordered are listed, but only abnormal results are displayed) Labs Reviewed - No data to display  EKG   Radiology No results found.  Procedures Procedures (including critical care time)  Medications Ordered in UC Medications - No data to display  Initial Impression / Assessment and Plan / UC Course  I have reviewed the triage vital signs and the nursing notes. Has covid infection. He did not want to have labs done to check his renal function for antiviral treatment. So I prescribed him the Molnupiravir as noted. As well as Tessalon [place]less for day time cough and Hycodan for night time cough as noted.  See instruction.      Final Clinical Impressions(s) / UC  Diagnoses   Final diagnoses:  COVID-19 virus  infection     Discharge Instructions      Stay quarantined for 5 days from the day you started then the next 5 days wear a mask if you are going out  If your Covid test ends up positive you may take the following supplements to help your immune system be stronger to fight this viral infection Take Zinc 50 mg ones a day x 7 days, the zinc helps open the cell channels in the cell to absorb Zinc. Zinc helps decrease the virus load in your body. Take Melatonin 6-10 mg at bed time which also helps support your immune system.  Also make sure to take Vit D 5,000 IU per day with a fatty meal and Vit C 5000 mg a day until you are completely better. To prevent viral illnesses your vitamin D should be between 60-80. Stay on Vitamin D 2,000  and C  1000 mg the rest of the season.  Don't lay around, keep active and walk as much as you are able to to prevent worsening of your symptoms.  Follow up with your family Dr next week.  If you get short of breath and you are able to check  your oxygen with a pulse oxygen meter, if it gets to 92% or less, you need to go to the hospital to be admitted. If you dont have one, come back here and we will assess you.       ED Prescriptions     Medication Sig Dispense Auth. Provider   molnupiravir EUA 200 MG CAPS Take 4 capsules (800 mg total) by mouth 2 (two) times daily. 40 capsule Rodriguez-Southworth, Zakiyyah Savannah, PA-C   HYDROcodone bit-homatropine (HYCODAN) 5-1.5 MG/5ML syrup Take 5 mLs by mouth at bedtime. 120 mL Rodriguez-Southworth, Marvine Encalade, PA-C   benzonatate (TESSALON) 200 MG capsule Take 1 capsule (200 mg total) by mouth 2 (two) times daily as needed for cough. For day time cough 30 capsule Rodriguez-Southworth, Nettie Elm, [place]      I have reviewed the PDMP during this encounter.   Garey Ham, PA-C 11/08/20 1026

## 2020-11-08 NOTE — Discharge Instructions (Signed)
Stay quarantined for 5 days from the day you started then the next 5 days wear a mask if you are going out  If your Covid test ends up positive you may take the following supplements to help your immune system be stronger to fight this viral infection Take Zinc 50 mg ones a day x 7 days, the zinc helps open the cell channels in the cell to absorb Zinc. Zinc helps decrease the virus load in your body. Take Melatonin 6-10 mg at bed time which also helps support your immune system.  Also make sure to take Vit D 5,000 IU per day with a fatty meal and Vit C 5000 mg a day until you are completely better. To prevent viral illnesses your vitamin D should be between 60-80. Stay on Vitamin D 2,000  and C  1000 mg the rest of the season.  Don't lay around, keep active and walk as much as you are able to to prevent worsening of your symptoms.  Follow up with your family Dr next week.  If you get short of breath and you are able to check  your oxygen with a pulse oxygen meter, if it gets to 92% or less, you need to go to the hospital to be admitted. If you dont have one, come back here and we will assess you.

## 2022-11-15 ENCOUNTER — Other Ambulatory Visit: Payer: Self-pay | Admitting: Orthopedic Surgery

## 2022-11-15 DIAGNOSIS — Z139 Encounter for screening, unspecified: Secondary | ICD-10-CM

## 2023-04-12 ENCOUNTER — Ambulatory Visit: Payer: Self-pay
